# Patient Record
Sex: Female | Born: 1970 | Race: Black or African American | Hispanic: No | Smoking: Current every day smoker
Health system: Southern US, Community
[De-identification: ages and names within clinical notes are randomized; demographics above are authoritative.]

## PROBLEM LIST (undated history)

## (undated) DIAGNOSIS — I10 Essential (primary) hypertension: Secondary | ICD-10-CM

## (undated) DIAGNOSIS — I639 Cerebral infarction, unspecified: Secondary | ICD-10-CM

## (undated) DIAGNOSIS — E7800 Pure hypercholesterolemia, unspecified: Secondary | ICD-10-CM

## (undated) DIAGNOSIS — E119 Type 2 diabetes mellitus without complications: Secondary | ICD-10-CM

---

## 2020-10-14 DIAGNOSIS — M25512 Pain in left shoulder: Secondary | ICD-10-CM | POA: Insufficient documentation

## 2020-11-04 DIAGNOSIS — M19012 Primary osteoarthritis, left shoulder: Secondary | ICD-10-CM | POA: Insufficient documentation

## 2020-11-04 DIAGNOSIS — M7542 Impingement syndrome of left shoulder: Secondary | ICD-10-CM | POA: Insufficient documentation

## 2020-11-23 ENCOUNTER — Other Ambulatory Visit: Payer: Self-pay | Admitting: Gastroenterology

## 2020-11-23 DIAGNOSIS — R768 Other specified abnormal immunological findings in serum: Secondary | ICD-10-CM

## 2020-11-30 ENCOUNTER — Ambulatory Visit
Admission: RE | Admit: 2020-11-30 | Discharge: 2020-11-30 | Disposition: A | Discharge status: Home / Self Care | Payer: 59 | Source: Ambulatory Visit | Attending: Gastroenterology | Admitting: Gastroenterology

## 2020-11-30 ENCOUNTER — Other Ambulatory Visit: Payer: Self-pay

## 2020-11-30 DIAGNOSIS — R768 Other specified abnormal immunological findings in serum: Secondary | ICD-10-CM

## 2020-12-03 ENCOUNTER — Other Ambulatory Visit: Payer: Self-pay | Admitting: Gastroenterology

## 2020-12-03 DIAGNOSIS — R935 Abnormal findings on diagnostic imaging of other abdominal regions, including retroperitoneum: Secondary | ICD-10-CM

## 2020-12-15 ENCOUNTER — Ambulatory Visit
Admission: RE | Admit: 2020-12-15 | Discharge: 2020-12-15 | Disposition: A | Discharge status: Home / Self Care | Payer: 59 | Source: Ambulatory Visit | Attending: Gastroenterology | Admitting: Gastroenterology

## 2020-12-15 ENCOUNTER — Other Ambulatory Visit: Payer: Self-pay

## 2020-12-15 DIAGNOSIS — R935 Abnormal findings on diagnostic imaging of other abdominal regions, including retroperitoneum: Secondary | ICD-10-CM

## 2020-12-15 MED ORDER — GADOBENATE DIMEGLUMINE 529 MG/ML IV SOLN
10.0000 mL | Freq: Once | INTRAVENOUS | Status: AC | PRN
  Administered 2020-12-15: 10 mL via INTRAVENOUS

## 2021-02-01 ENCOUNTER — Encounter (HOSPITAL_BASED_OUTPATIENT_CLINIC_OR_DEPARTMENT_OTHER): Payer: Self-pay | Admitting: *Deleted

## 2021-02-01 ENCOUNTER — Emergency Department (HOSPITAL_BASED_OUTPATIENT_CLINIC_OR_DEPARTMENT_OTHER): Payer: 59

## 2021-02-01 ENCOUNTER — Other Ambulatory Visit: Payer: Self-pay

## 2021-02-01 ENCOUNTER — Emergency Department (HOSPITAL_BASED_OUTPATIENT_CLINIC_OR_DEPARTMENT_OTHER)
Admission: EM | Admit: 2021-02-01 | Discharge: 2021-02-01 | Disposition: A | Discharge status: Home / Self Care | Payer: 59 | Attending: Emergency Medicine | Admitting: Emergency Medicine

## 2021-02-01 DIAGNOSIS — Z79899 Other long term (current) drug therapy: Secondary | ICD-10-CM | POA: Diagnosis not present

## 2021-02-01 DIAGNOSIS — R091 Pleurisy: Secondary | ICD-10-CM | POA: Insufficient documentation

## 2021-02-01 DIAGNOSIS — I10 Essential (primary) hypertension: Secondary | ICD-10-CM | POA: Diagnosis not present

## 2021-02-01 DIAGNOSIS — M549 Dorsalgia, unspecified: Secondary | ICD-10-CM | POA: Diagnosis not present

## 2021-02-01 DIAGNOSIS — R079 Chest pain, unspecified: Secondary | ICD-10-CM | POA: Diagnosis present

## 2021-02-01 DIAGNOSIS — F17210 Nicotine dependence, cigarettes, uncomplicated: Secondary | ICD-10-CM | POA: Diagnosis not present

## 2021-02-01 DIAGNOSIS — Z7902 Long term (current) use of antithrombotics/antiplatelets: Secondary | ICD-10-CM | POA: Diagnosis not present

## 2021-02-01 HISTORY — DX: Cerebral infarction, unspecified: I63.9

## 2021-02-01 HISTORY — DX: Essential (primary) hypertension: I10

## 2021-02-01 LAB — CBC
HCT: 38.5 % (ref 36.0–46.0)
Hemoglobin: 12.8 g/dL (ref 12.0–15.0)
MCH: 32.6 pg (ref 26.0–34.0)
MCHC: 33.2 g/dL (ref 30.0–36.0)
MCV: 98 fL (ref 80.0–100.0)
Platelets: 361 10*3/uL (ref 150–400)
RBC: 3.93 MIL/uL (ref 3.87–5.11)
RDW: 13.2 % (ref 11.5–15.5)
WBC: 10.5 10*3/uL (ref 4.0–10.5)
nRBC: 0 % (ref 0.0–0.2)

## 2021-02-01 LAB — BASIC METABOLIC PANEL
Anion gap: 9 (ref 5–15)
BUN: 13 mg/dL (ref 6–20)
CO2: 23 mmol/L (ref 22–32)
Calcium: 9.3 mg/dL (ref 8.9–10.3)
Chloride: 107 mmol/L (ref 98–111)
Creatinine, Ser: 0.69 mg/dL (ref 0.44–1.00)
GFR, Estimated: >60 mL/min (ref >60)
Glucose, Bld: 97 mg/dL (ref 70–99)
Potassium: 3.8 mmol/L (ref 3.5–5.1)
Sodium: 139 mmol/L (ref 135–145)

## 2021-02-01 LAB — TROPONIN I (HIGH SENSITIVITY)
Troponin I (High Sensitivity): 4 ng/L (ref <18)
Troponin I (High Sensitivity): 3 ng/L (ref <18)

## 2021-02-01 LAB — D-DIMER, QUANTITATIVE: D-Dimer, Quant: 0.29 ug/mL-FEU (ref 0.00–0.50)

## 2021-02-01 LAB — PREGNANCY, URINE: Preg Test, Ur: NEGATIVE

## 2021-02-01 MED ORDER — DOXYCYCLINE HYCLATE 100 MG PO TABS: 100.0000 mg | ORAL_TABLET | Freq: Two times a day (BID) | ORAL | 0 refills | Status: AC

## 2021-02-01 MED ORDER — ETODOLAC 300 MG PO CAPS: 300.0000 mg | ORAL_CAPSULE | Freq: Three times a day (TID) | ORAL | 0 refills | Status: AC

## 2021-02-01 MED ORDER — ASPIRIN 81 MG PO CHEW
324.0000 mg | CHEWABLE_TABLET | Freq: Once | ORAL | Status: AC
  Administered 2021-02-01: 324 mg via ORAL
  Filled 2021-02-01: qty 4

## 2021-02-01 MED ORDER — MORPHINE SULFATE (PF) 4 MG/ML IV SOLN
4.0000 mg | Freq: Once | INTRAVENOUS | Status: AC
Start: 2021-02-01 — End: 2021-02-01
  Administered 2021-02-01: 4 mg via INTRAVENOUS

## 2021-02-01 NOTE — ED Notes (Signed)
PO snacks and oral fluids given to pt per pt request; okayed by Md.

## 2021-02-01 NOTE — ED Provider Notes (Signed)
[facility] EMERGENCY DEPT Provider Note   CSN: [id number] Arrival date & time: 02/01/21  9622     History Chief Complaint  Patient presents with   Chest Pain   Back Pain    Brandi Saunders is a 50 y.o. female.   Chest Pain Associated symptoms: back pain   Back Pain Associated symptoms: chest pain    HPI: A 50 year old patient with a history of CVA, hypertension and obesity presents for evaluation of chest pain. Initial onset of pain was approximately 1-3 hours ago. The patient's chest pain is well-localized, is sharp and is not worse with exertion. The patient's chest pain is middle- or left-sided, is not described as heaviness/pressure/tightness and does not radiate to the arms/jaw/neck. The patient does not complain of nausea and denies diaphoresis. The patient has smoked in the past 90 days. The patient has no history of peripheral artery disease, denies any history of treated diabetes, has no relevant family history of coronary artery disease (first degree relative at less than age 73) and has no history of hypercholesterolemia.  Pt experienced sharp pain this morning.  Increases with taking a deep breath.  Radiates to the back.  No leg swelling.  No hx of PE or DVT Past Medical History:  Diagnosis Date   Hypertension    Stroke [facility])     There are no problems to display for this patient.   History reviewed. No pertinent surgical history.   OB History     Gravida  14   Para  7   Term      Preterm  1   AB  5   Living         SAB      IAB      Ectopic      Multiple      Live Births              History reviewed. No pertinent family history.  Social History   Tobacco Use   Smoking status: Every Day    Packs/day: 0.50    Types: Cigarettes   Smokeless tobacco: Never  Vaping Use   Vaping Use: Never used  Substance Use Topics   Alcohol use: Yes    Comment: occasoinally   Drug use: Yes    Types: Marijuana    Comment: daily     Home Medications Prior to Admission medications   Medication Sig Start Date End Date Taking? Authorizing Provider  clopidogrel (PLAVIX) 75 MG tablet Take 75 mg by mouth daily. 02/01/21  Yes [provider]  cyclobenzaprine (FLEXERIL) 10 MG tablet cyclobenzaprine 10 mg tablet   Yes [provider]  doxycycline (VIBRA-TABS) 100 MG tablet Take 1 tablet (100 mg total) by mouth 2 (two) times daily for 7 days. 02/01/21 02/08/21 Yes Linwood Dibbles, MD  etodolac (LODINE) 300 MG capsule Take 1 capsule (300 mg total) by mouth every 8 (eight) hours for 7 days. 02/01/21 02/08/21 Yes Linwood Dibbles, MD  hydrochlorothiazide (HYDRODIURIL) 25 MG tablet Take 25 mg by mouth daily. 02/01/21  Yes [provider]  lisinopril (ZESTRIL) 40 MG tablet Take 40 mg by mouth daily. 02/01/21  Yes [provider]  simvastatin (ZOCOR) 20 MG tablet simvastatin 20 mg tablet   Yes [provider]    Allergies    Patient has no known allergies.  Review of Systems   Review of Systems  Cardiovascular:  Positive for chest pain.  Musculoskeletal:  Positive for back pain.  All  other systems reviewed and are negative.  Physical Exam Updated Vital Signs BP 125/83   Pulse 70   Temp 98.7 F (37.1 C) (Oral)   Resp 20   Ht 1.549 m (5\' 1" )   Wt 77.6 kg   LMP 02/01/2021 (Exact Date)   SpO2 99%   BMI 32.31 kg/m   Physical Exam Vitals and nursing note reviewed.  Constitutional:      General: She is not in acute distress.    Appearance: She is well-developed.  HENT:     Head: Normocephalic and atraumatic.     Right Ear: External ear normal.     Left Ear: External ear normal.  Eyes:     General: No scleral icterus.       Right eye: No discharge.        Left eye: No discharge.     Conjunctiva/sclera: Conjunctivae normal.  Neck:     Trachea: No tracheal deviation.  Cardiovascular:     Rate and Rhythm: Normal rate and regular rhythm.  Pulmonary:     Effort: Pulmonary effort is  normal. No respiratory distress.     Breath sounds: Normal breath sounds. No stridor. No wheezing or rales.  Abdominal:     General: Bowel sounds are normal. There is no distension.     Palpations: Abdomen is soft.     Tenderness: There is no abdominal tenderness. There is no guarding or rebound.  Musculoskeletal:        General: No tenderness or deformity.     Cervical back: Neck supple.  Skin:    General: Skin is warm and dry.     Findings: No rash.  Neurological:     General: No focal deficit present.     Mental Status: She is alert.     Cranial Nerves: No cranial nerve deficit (no facial droop, extraocular movements intact, no slurred speech).     Sensory: No sensory deficit.     Motor: No abnormal muscle tone or seizure activity.     Coordination: Coordination normal.  Psychiatric:        Mood and Affect: Mood normal.    ED Results / Procedures / Treatments   Labs (all labs ordered are listed, but only abnormal results are displayed) Labs Reviewed  BASIC METABOLIC PANEL  CBC  PREGNANCY, URINE  D-DIMER, QUANTITATIVE  TROPONIN I (HIGH SENSITIVITY)  TROPONIN I (HIGH SENSITIVITY)    EKG EKG Interpretation  Date/Time:  Wednesday February 01 2021 09:14:47 EDT Ventricular Rate:  72 PR Interval:  148 QRS Duration: 98 QT Interval:  396 QTC Calculation: 434 R Axis:   6 Text Interpretation: Sinus rhythm Nonspecific T abnrm, anterolateral leads Artifact Confirmed by 09-23[birth year]1989 [phone]) on 02/01/2021 9:16:25 AM  Radiology DG Chest Portable 1 View  Result Date: 02/01/2021 CLINICAL DATA:  Chest pain. EXAM: PORTABLE CHEST 1 VIEW COMPARISON:  None. FINDINGS: Mild left basilar opacities. No visible pneumothorax or pleural effusion. Borderline enlargement of the cardiac silhouette. No acute osseous abnormality. Slight S-shaped thoracic curvature. IMPRESSION: 1. Mild left basilar opacities, which could represent atelectasis or early pneumonia. 2. Borderline cardiomegaly.  Electronically Signed   By: 02/03/2021 M.D.   On: 02/01/2021 10:00    Procedures Procedures   Medications Ordered in ED Medications  aspirin chewable tablet 324 mg (324 mg Oral Given 02/01/21 1020)  morphine 4 MG/ML injection 4 mg (4 mg Intravenous Given 02/01/21 1020)    ED Course  I have reviewed the triage vital signs  and the nursing notes.  Pertinent labs & imaging results that were available during my care of the patient were reviewed by me and considered in my medical decision making (see chart for details).  Clinical Course as of 02/01/21 1310  Wed Feb 01, 2021  1019 Initial trop, labs normal.  No signs of cardiac ischemia.  Low risk for PE.  D dimer negative [JK]  1020 CXR atelectasis vs pna [JK]    Clinical Course User Index [JK] Linwood Dibbles, MD   MDM Rules/Calculators/A&P HEAR Score: 4                         ED work-up is reassuring.  No signs of pneumothorax on chest x-ray.  Questionable pneumonia although could be atelectasis.  Patient is not having any other infectious symptoms.  D-dimer is negative.  Doubt PE.  Serial troponins are normal.  Doubt symptoms related to acute coronary syndrome.  Symptoms may be related to viral pleurisy.  Will discharge home with NSAIDs   Because of the question of pneumonia we will give her a course of antibiotics.  Outpatient follow-up Final Clinical Impression(s) / ED Diagnoses Final diagnoses:  Pleurisy    Rx / DC Orders ED Discharge Orders          Ordered    doxycycline (VIBRA-TABS) 100 MG tablet  2 times daily        02/01/21 1305    etodolac (LODINE) 300 MG capsule  Every 8 hours       Note to Pharmacy: As needed for pain   02/01/21 1305             Linwood Dibbles, MD 02/01/21 1310

## 2021-02-01 NOTE — ED Notes (Signed)
ED Provider at bedside. 

## 2021-02-01 NOTE — ED Triage Notes (Signed)
Woke up this morning with pain to her chest radiating to her back when she takes a deep breath.  Patient is a smoker.

## 2021-02-01 NOTE — Discharge Instructions (Addendum)
Take the Lodine to help with the pain and inflammation.  As we discussed, the chest x-ray showed a possibility of pneumonia although it was not definitive.  Doxycycline is an antibiotic to treat pneumonia.  Start taking that if you start having symptoms, fevers in the last couple days

## 2021-05-19 ENCOUNTER — Ambulatory Visit (HOSPITAL_COMMUNITY)
Admission: EM | Admit: 2021-05-19 | Discharge: 2021-05-19 | Disposition: A | Discharge status: Home / Self Care | Payer: 59 | Attending: Physician Assistant | Admitting: Physician Assistant

## 2021-05-19 ENCOUNTER — Other Ambulatory Visit: Payer: Self-pay

## 2021-05-19 ENCOUNTER — Encounter (HOSPITAL_COMMUNITY): Payer: Self-pay | Admitting: Emergency Medicine

## 2021-05-19 DIAGNOSIS — T783XXA Angioneurotic edema, initial encounter: Secondary | ICD-10-CM

## 2021-05-19 DIAGNOSIS — S93402A Sprain of unspecified ligament of left ankle, initial encounter: Secondary | ICD-10-CM

## 2021-05-19 MED ORDER — AMLODIPINE BESYLATE 5 MG PO TABS: 5.0000 mg | ORAL_TABLET | Freq: Every day | ORAL | 1 refills | Status: AC

## 2021-05-19 NOTE — ED Provider Notes (Signed)
[facility]    CSN: [id number] Arrival date & time: 05/19/21  8315      History   Chief Complaint Chief Complaint  Patient presents with   Facial Swelling   Ankle Pain    HPI Brandi Saunders is a 51 y.o. female.   Pt turned her left ankle.  Pt complains of continued pain.  Pt reports pain with walking.  Pt also complains of swelling to her lip for 3 days.  Pt is on lisinopril.    The history is provided by the patient. No language interpreter was used.  Ankle Pain Location:  Knee Worsened by:  Nothing Ineffective treatments:  None tried  Past Medical History:  Diagnosis Date   Hypertension    Stroke (HCC)     There are no problems to display for this patient.   History reviewed. No pertinent surgical history.  OB History     Gravida  14   Para  7   Term      Preterm  1   AB  5   Living         SAB      IAB      Ectopic      Multiple      Live Births               Home Medications    Prior to Admission medications   Medication Sig Start Date End Date Taking? Authorizing Provider  amLODipine (NORVASC) 5 MG tablet Take 1 tablet (5 mg total) by mouth daily. 05/19/21 05/19/22 Yes Elson Areas, PA-C  clopidogrel (PLAVIX) 75 MG tablet Take 75 mg by mouth daily. 02/01/21   [provider]  cyclobenzaprine (FLEXERIL) 10 MG tablet cyclobenzaprine 10 mg tablet    [provider]  hydrochlorothiazide (HYDRODIURIL) 25 MG tablet Take 25 mg by mouth daily. 02/01/21   [provider]  lisinopril (ZESTRIL) 40 MG tablet Take 40 mg by mouth daily. 02/01/21   [provider]  simvastatin (ZOCOR) 20 MG tablet simvastatin 20 mg tablet    [provider]    Family History No family history on file.  Social History Social History   Tobacco Use   Smoking status: Every Day    Packs/day: 0.50    Types: Cigarettes   Smokeless tobacco: Never  Vaping Use   Vaping Use: Never used  Substance Use Topics    Alcohol use: Yes    Comment: occasoinally   Drug use: Yes    Types: Marijuana    Comment: daily     Allergies   Patient has no known allergies.   Review of Systems Review of Systems  All other systems reviewed and are negative.   Physical Exam Triage Vital Signs ED Triage Vitals  Enc Vitals Group     BP 05/19/21 1053 104/78     Pulse Rate 05/19/21 1053 76     Resp 05/19/21 1053 16     Temp 05/19/21 1053 98.2 F (36.8 C)     Temp Source 05/19/21 1053 Oral     SpO2 05/19/21 1053 99 %     Weight --      Height --      Head Circumference --      Peak Flow --      Pain Score 05/19/21 1050 0     Pain Loc --      Pain Edu? --      Excl. in GC? --  No data found.  Updated Vital Signs BP 104/78 (BP Location: Left Arm)    Pulse 76    Temp 98.2 F (36.8 C) (Oral)    Resp 16    SpO2 99%   Visual Acuity Right Eye Distance:   Left Eye Distance:   Bilateral Distance:    Right Eye Near:   Left Eye Near:    Bilateral Near:     Physical Exam Vitals and nursing note reviewed.  Constitutional:      Appearance: She is well-developed.  HENT:     Head: Normocephalic.     Mouth/Throat:     Mouth: Mucous membranes are moist.     Comments: Swollen upper lip  Cardiovascular:     Rate and Rhythm: Normal rate.  Pulmonary:     Effort: Pulmonary effort is normal.  Abdominal:     General: There is no distension.  Musculoskeletal:        General: Swelling and tenderness present. Normal range of motion.     Cervical back: Normal range of motion.     Comments: tender left ankle   Skin:    General: Skin is warm.  Neurological:     Mental Status: She is alert and oriented to person, place, and time.  Psychiatric:        Mood and Affect: Mood normal.     UC Treatments / Results  Labs (all labs ordered are listed, but only abnormal results are displayed) Labs Reviewed - No data to display  EKG   Radiology No results found.  Procedures Procedures (including  critical care time)  Medications Ordered in UC Medications - No data to display  Initial Impression / Assessment and Plan / UC Course  I have reviewed the triage vital signs and the nursing notes.  Pertinent labs & imaging results that were available during my care of the patient were reviewed by me and considered in my medical decision making (see chart for details).     MDM:  Pt counseled on angioedema.  Pt advised to stop lisinopril.  Pt given rx for amlodipine Pt placed in an ace wrap for ankle sprain   Final Clinical Impressions(s) / UC Diagnoses   Final diagnoses:  Angioedema, initial encounter  Sprain of left ankle, unspecified ligament, initial encounter     Discharge Instructions      Stop taking lisinopril.     ED Prescriptions     Medication Sig Dispense Auth. Provider   amLODipine (NORVASC) 5 MG tablet Take 1 tablet (5 mg total) by mouth daily. 30 tablet Elson Areas, [place]      PDMP not reviewed this encounter. An After Visit Summary was printed and given to the patient.    Elson Areas, [place] 05/19/21 1122

## 2021-05-19 NOTE — Discharge Instructions (Addendum)
Stop taking lisinopril

## 2021-05-19 NOTE — ED Triage Notes (Signed)
Pt reports left side facial swelling for 2 days. Denies new foods, medications, etc.  Reports couple weeks ago fell when stepping off her porch. Left ankle pains.

## 2021-12-18 ENCOUNTER — Other Ambulatory Visit: Payer: Self-pay | Admitting: Registered Nurse

## 2021-12-18 DIAGNOSIS — I70223 Atherosclerosis of native arteries of extremities with rest pain, bilateral legs: Secondary | ICD-10-CM

## 2021-12-20 ENCOUNTER — Ambulatory Visit
Admission: RE | Admit: 2021-12-20 | Discharge: 2021-12-20 | Disposition: A | Discharge status: Home / Self Care | Payer: Medicaid Other | Source: Ambulatory Visit | Attending: Registered Nurse | Admitting: Registered Nurse

## 2021-12-20 DIAGNOSIS — I70223 Atherosclerosis of native arteries of extremities with rest pain, bilateral legs: Secondary | ICD-10-CM

## 2022-03-14 ENCOUNTER — Ambulatory Visit: Payer: Medicare Other | Admitting: Podiatry

## 2022-03-21 ENCOUNTER — Ambulatory Visit (INDEPENDENT_AMBULATORY_CARE_PROVIDER_SITE_OTHER): Payer: Medicare Other | Admitting: Podiatry

## 2022-03-21 ENCOUNTER — Encounter: Payer: Self-pay | Admitting: Podiatry

## 2022-03-21 DIAGNOSIS — L84 Corns and callosities: Secondary | ICD-10-CM | POA: Diagnosis not present

## 2022-03-21 NOTE — Progress Notes (Signed)
Subjective: Brandi Saunders presents today referred by Podiatrist, Dr. Larey Dresser for follow up of callus(es) left lower extremity. Patient states "something" was applied to her foot and she was told to follow up with our office.  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 No pertinent surgical history.   Current Outpatient Medications on File Prior to Visit  Medication Sig Dispense Refill   amLODipine (NORVASC) 5 MG tablet Take 1 tablet (5 mg total) by mouth daily. 30 tablet 1   clopidogrel (PLAVIX) 75 MG tablet Take 75 mg by mouth daily.     cyclobenzaprine (FLEXERIL) 10 MG tablet cyclobenzaprine 10 mg tablet     hydrochlorothiazide (HYDRODIURIL) 25 MG tablet Take 25 mg by mouth daily.     lisinopril (ZESTRIL) 40 MG tablet Take 40 mg by mouth daily.     simvastatin (ZOCOR) 20 MG tablet simvastatin 20 mg tablet     No current facility-administered medications on file prior to visit.     No Known Allergies   Social History   Occupational History   Not on file  Tobacco Use   Smoking status: Every Day    Packs/day: 0.50    Types: Cigarettes   Smokeless tobacco: Never  Vaping Use   Vaping Use: Never used  Substance and Sexual Activity   Alcohol use: Yes    Comment: occasoinally   Drug use: Yes    Types: Marijuana    Comment: daily   Sexual activity: Not on file     History reviewed. No pertinent family history.    There is no immunization history on file for this patient.   Objective: There were no vitals filed for this visit.  Brandi Saunders is a pleasant 51 y.o. female WD, WN in NAD. AAO x 3.  Vascular Examination: Vascular status intact b/l with palpable pedal pulses. Pedal hair present  b/l. CFT immediate b/l. No edema. No pain with calf compression b/l. Skin temperature gradient WNL b/l.   Neurological Examination: Sensation grossly intact b/l with 10 gram monofilament. Vibratory sensation intact b/l.   Dermatological Examination: Pedal skin with normal turgor, texture and tone b/l.  Possible evidence of Cantharone solution submet head 5 left foot. Minimal hyperkeratosis submet head 5 left foot. No open wounds b/l LE. No interdigital macerations noted b/l LE.    Musculoskeletal Examination: Muscle strength 5/5 to b/l LE. No pain, crepitus or joint limitation noted with ROM bilateral LE. No gross bony deformities bilaterally.  Radiographs: None  Assessment: 1. Callus     Plan: -Patient was evaluated and treated. All patient's and/or POA's questions/concerns answered on today's visit. -Examined patient. -Minimal hyperkeratosis noted s/p cantharone treatment. No debridement necessary today. -Patient to continue soft, supportive shoe gear daily. -Patient/POA to call should there be question/concern in the interim.  Return if symptoms worsen or fail to improve.  Freddie Breech, DPM

## 2022-04-25 ENCOUNTER — Ambulatory Visit: Payer: Medicare Other | Admitting: Podiatry

## 2022-08-28 DIAGNOSIS — Z1151 Encounter for screening for human papillomavirus (HPV): Secondary | ICD-10-CM | POA: Diagnosis not present

## 2022-08-28 DIAGNOSIS — N898 Other specified noninflammatory disorders of vagina: Secondary | ICD-10-CM | POA: Diagnosis not present

## 2022-08-28 DIAGNOSIS — N762 Acute vulvitis: Secondary | ICD-10-CM | POA: Diagnosis not present

## 2022-08-28 DIAGNOSIS — Z124 Encounter for screening for malignant neoplasm of cervix: Secondary | ICD-10-CM | POA: Diagnosis not present

## 2022-08-28 DIAGNOSIS — Z113 Encounter for screening for infections with a predominantly sexual mode of transmission: Secondary | ICD-10-CM | POA: Diagnosis not present

## 2022-08-28 DIAGNOSIS — Z01419 Encounter for gynecological examination (general) (routine) without abnormal findings: Secondary | ICD-10-CM | POA: Diagnosis not present

## 2022-09-04 IMAGING — US US ABDOMEN LIMITED
1 series · 14 of 25 positions shown · non-contrast
Comparison: None.

CLINICAL DATA: Hepatitis-B antibody positive

EXAM:
ULTRASOUND ABDOMEN LIMITED RIGHT UPPER QUADRANT

[Series 1: us abdomen limited · 0.19mm/px · 14 of 74 slices shown]
[im 1/74]
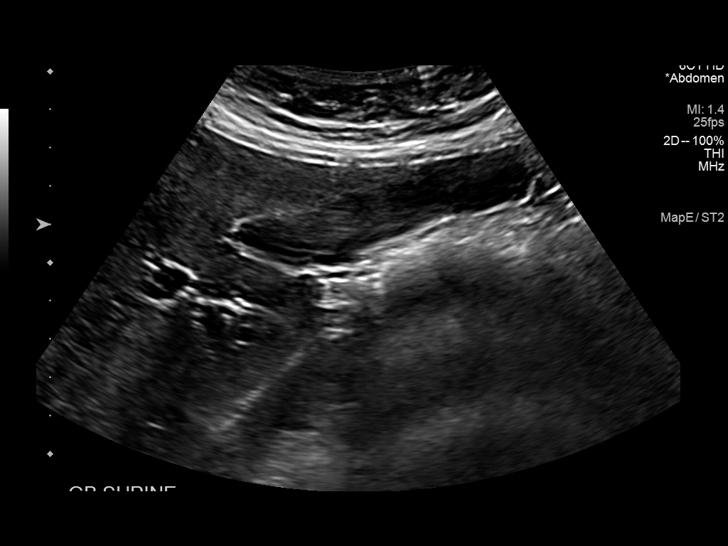
[im 7/74]
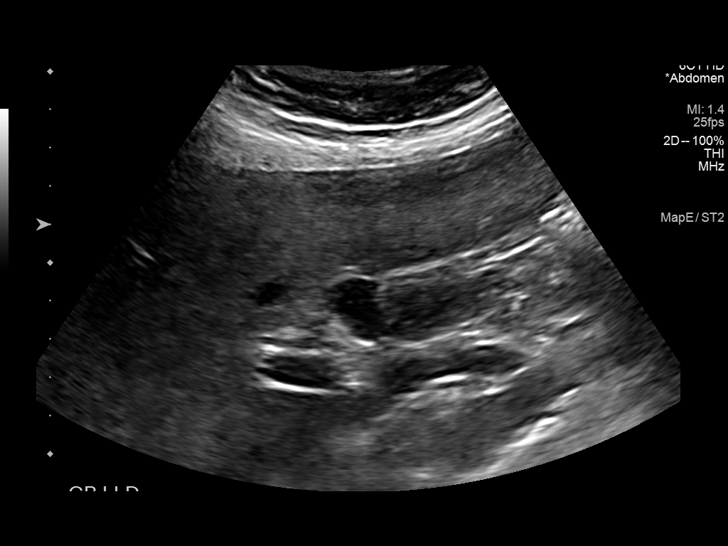
[im 13/74]
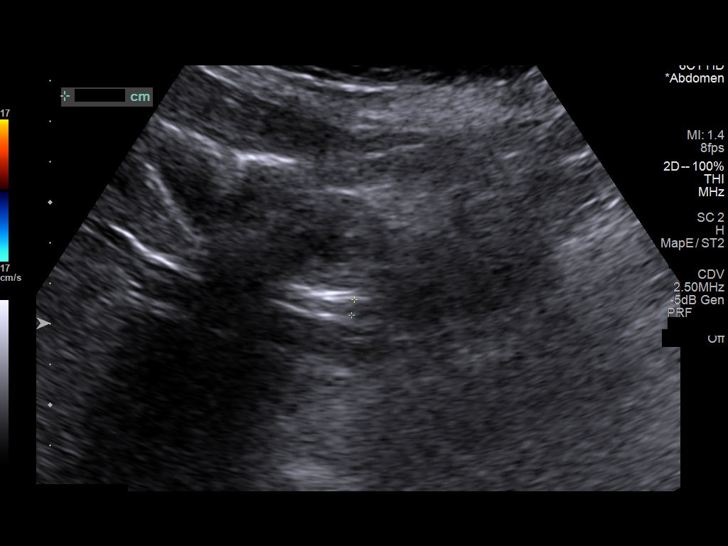
[im 19/74]
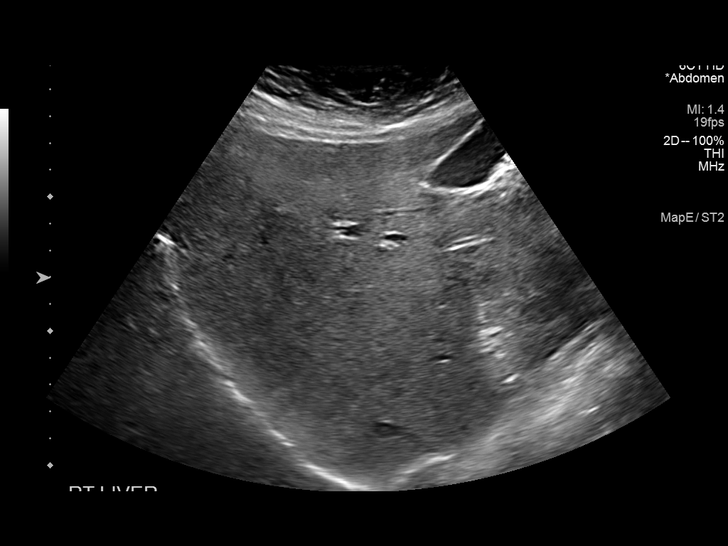
[im 25/74]
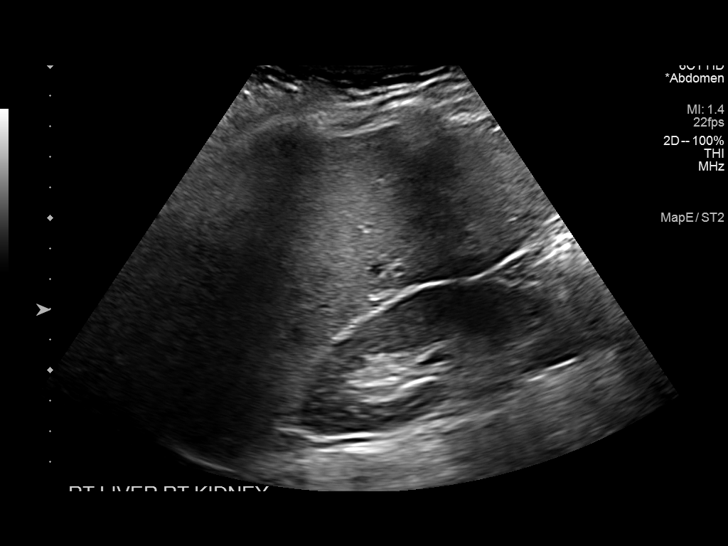
[im 28/74]
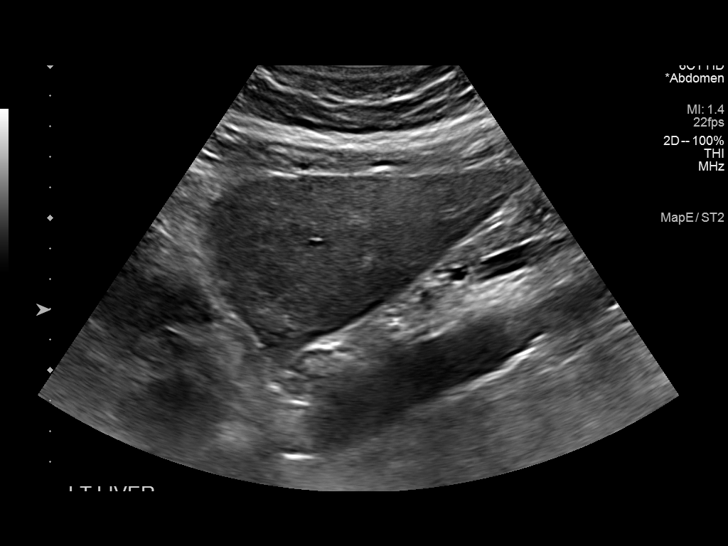
[im 34/74]
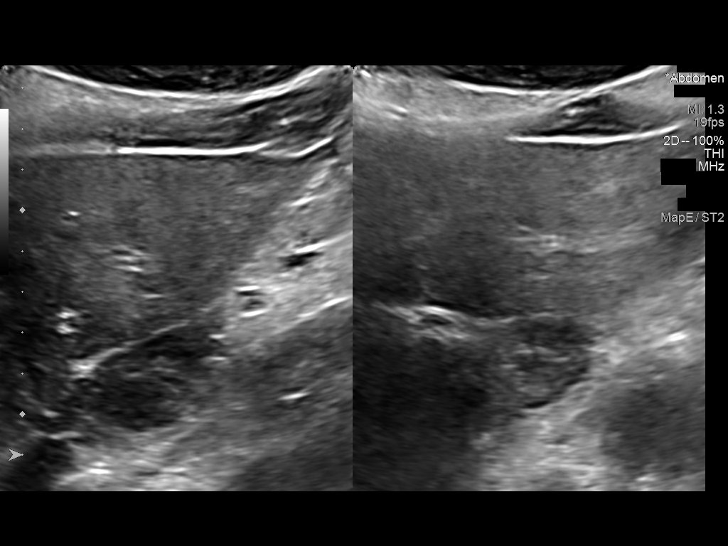
[im 40/74]
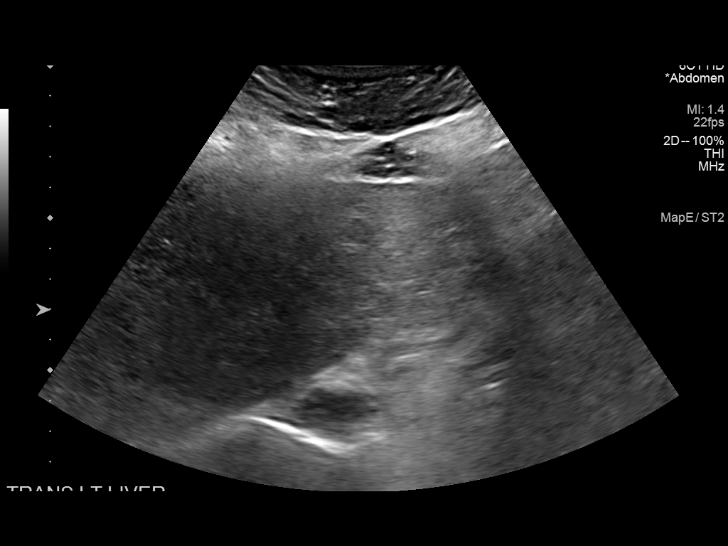
[im 46/74]
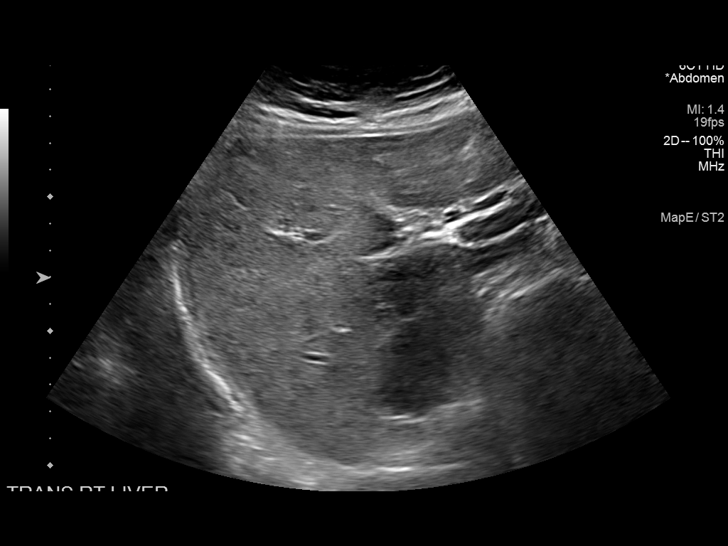
[im 49/74]
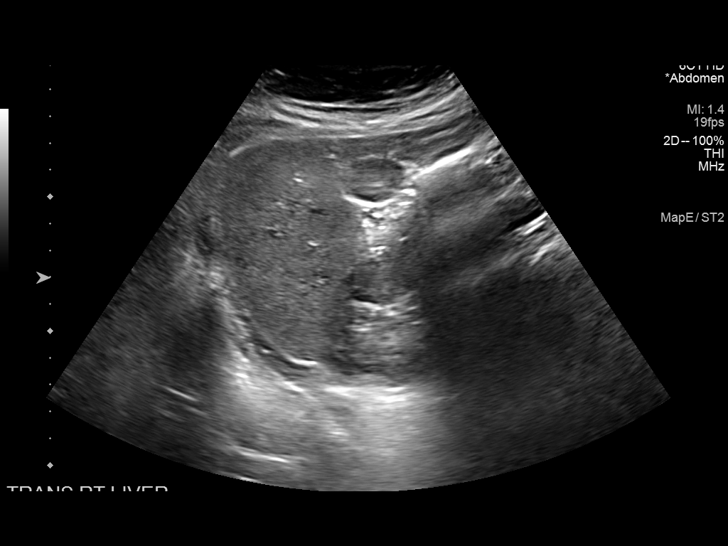
[im 55/74]
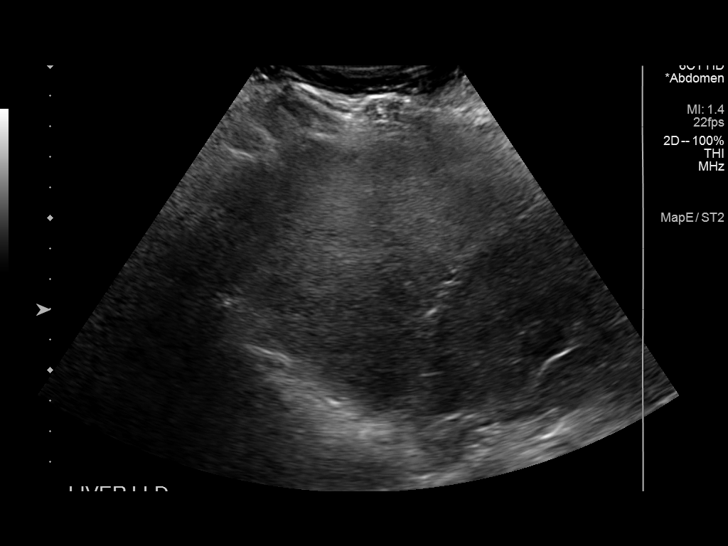
[im 61/74]
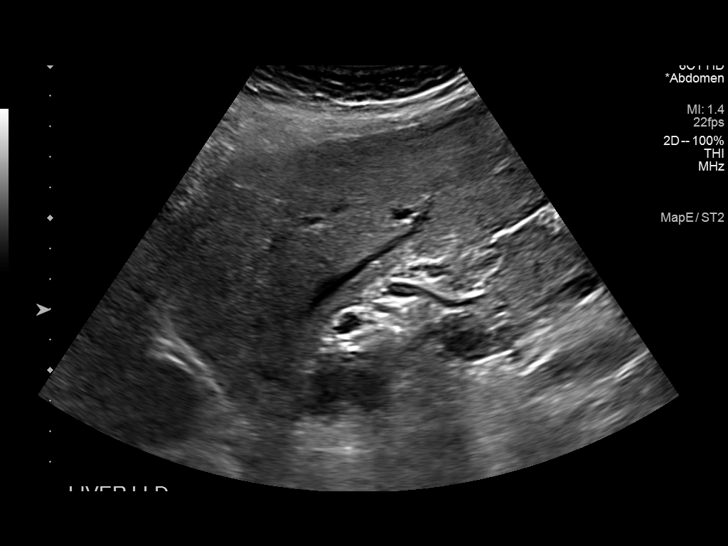
[im 67/74]
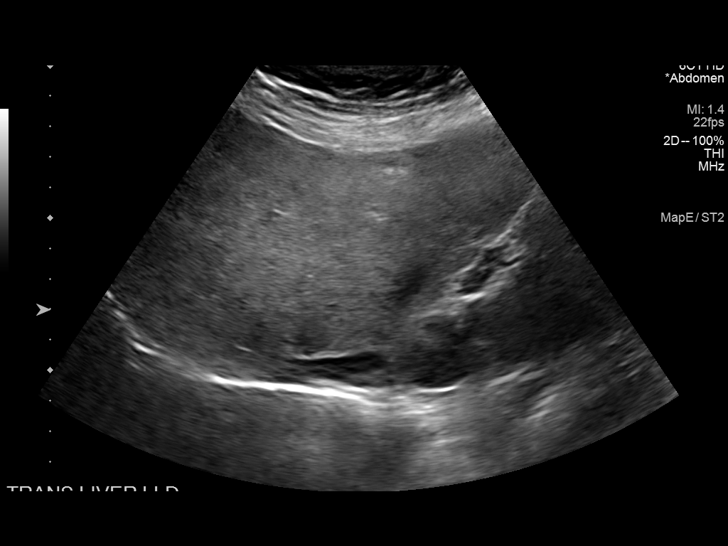
[im 74/74]
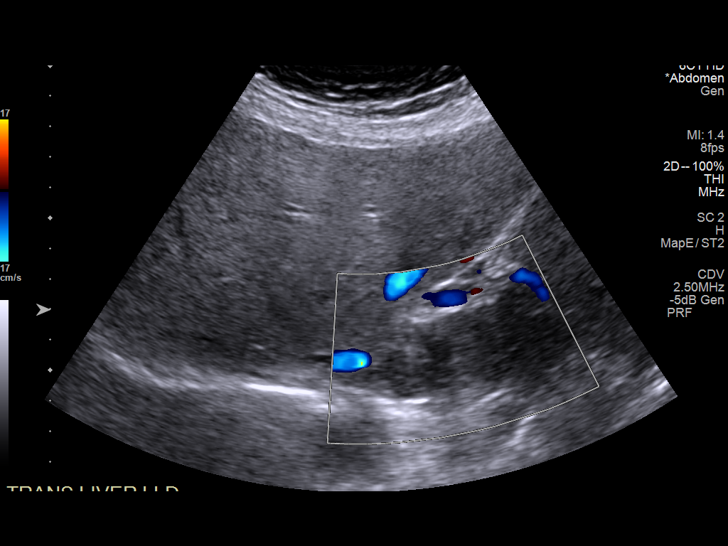

[14 of 25 positions shown; findings below may reference images not displayed]

FINDINGS: Gallbladder:

No gallstones or wall thickening visualized. No sonographic Murphy
sign noted by sonographer.

Common bile duct:

Diameter: 4 mm, normal

Liver:

There is heterogeneous liver echotexture. Liver echotexture is
diffusely increased. In the region of the caudate lobe, there is
focally heterogeneous rounded tissue. It measures approximately
x 2.2 x 2.8 cm. Portal vein is patent on color Doppler imaging with
normal direction of blood flow towards the liver.

Other: None.
IMPRESSION: 1. The caudate lobe is rounded in appearance with focally
heterogeneous tissue versus underlying mass which measures 3.3 cm.
Given clinical history, recommend further evaluation with dedicated
liver MRI with and without contrast to evaluate for underlying
lesion.
2. Hepatic steatosis.

These results will be called to the ordering clinician or
representative by the Radiologist Assistant, and communication
documented in the PACS or [REDACTED].

## 2022-09-27 ENCOUNTER — Other Ambulatory Visit: Payer: Self-pay

## 2022-10-01 ENCOUNTER — Emergency Department (HOSPITAL_COMMUNITY): Payer: 59

## 2022-10-01 ENCOUNTER — Emergency Department (HOSPITAL_COMMUNITY)
Admission: EM | Admit: 2022-10-01 | Discharge: 2022-10-01 | Disposition: A | Discharge status: Home / Self Care | Payer: 59 | Attending: Emergency Medicine | Admitting: Emergency Medicine

## 2022-10-01 ENCOUNTER — Encounter (HOSPITAL_COMMUNITY): Payer: Self-pay

## 2022-10-01 DIAGNOSIS — R5383 Other fatigue: Secondary | ICD-10-CM | POA: Diagnosis not present

## 2022-10-01 DIAGNOSIS — E876 Hypokalemia: Secondary | ICD-10-CM | POA: Diagnosis not present

## 2022-10-01 DIAGNOSIS — I69351 Hemiplegia and hemiparesis following cerebral infarction affecting right dominant side: Secondary | ICD-10-CM | POA: Insufficient documentation

## 2022-10-01 DIAGNOSIS — Z79899 Other long term (current) drug therapy: Secondary | ICD-10-CM | POA: Diagnosis not present

## 2022-10-01 DIAGNOSIS — R531 Weakness: Secondary | ICD-10-CM

## 2022-10-01 DIAGNOSIS — I10 Essential (primary) hypertension: Secondary | ICD-10-CM | POA: Diagnosis not present

## 2022-10-01 DIAGNOSIS — Z7982 Long term (current) use of aspirin: Secondary | ICD-10-CM | POA: Insufficient documentation

## 2022-10-01 HISTORY — DX: Pure hypercholesterolemia, unspecified: E78.00

## 2022-10-01 HISTORY — DX: Type 2 diabetes mellitus without complications: E11.9

## 2022-10-01 LAB — COMPREHENSIVE METABOLIC PANEL
ALT: 20 U/L (ref 0–44)
AST: 21 U/L (ref 15–41)
Albumin: 3.7 g/dL (ref 3.5–5.0)
Alkaline Phosphatase: 64 U/L (ref 38–126)
Anion gap: 13 (ref 5–15)
BUN: 13 mg/dL (ref 6–20)
CO2: 27 mmol/L (ref 22–32)
Calcium: 10 mg/dL (ref 8.9–10.3)
Chloride: 98 mmol/L (ref 98–111)
Creatinine, Ser: 0.83 mg/dL (ref 0.44–1.00)
GFR, Estimated: >60 mL/min (ref >60)
Glucose, Bld: 95 mg/dL (ref 70–99)
Potassium: 2.8 mmol/L — ABNORMAL LOW (ref 3.5–5.1)
Sodium: 138 mmol/L (ref 135–145)
Total Bilirubin: 0.6 mg/dL (ref 0.3–1.2)
Total Protein: 7.3 g/dL (ref 6.5–8.1)

## 2022-10-01 LAB — CK: Total CK: 110 U/L (ref 38–234)

## 2022-10-01 LAB — CBC WITH DIFFERENTIAL/PLATELET
Abs Immature Granulocytes: 0.03 10*3/uL (ref 0.00–0.07)
Basophils Absolute: 0 10*3/uL (ref 0.0–0.1)
Basophils Relative: 0 %
Eosinophils Absolute: 0.1 10*3/uL (ref 0.0–0.5)
Eosinophils Relative: 1 %
HCT: 42.6 % (ref 36.0–46.0)
Hemoglobin: 14.1 g/dL (ref 12.0–15.0)
Immature Granulocytes: 0 %
Lymphocytes Relative: 31 %
Lymphs Abs: 3.7 10*3/uL (ref 0.7–4.0)
MCH: 32 pg (ref 26.0–34.0)
MCHC: 33.1 g/dL (ref 30.0–36.0)
MCV: 96.6 fL (ref 80.0–100.0)
Monocytes Absolute: 0.9 10*3/uL (ref 0.1–1.0)
Monocytes Relative: 7 %
Neutro Abs: 7.1 10*3/uL (ref 1.7–7.7)
Neutrophils Relative %: 61 %
Platelets: 338 10*3/uL (ref 150–400)
RBC: 4.41 MIL/uL (ref 3.87–5.11)
RDW: 12.9 % (ref 11.5–15.5)
WBC: 11.8 10*3/uL — ABNORMAL HIGH (ref 4.0–10.5)
nRBC: 0 % (ref 0.0–0.2)

## 2022-10-01 LAB — MAGNESIUM: Magnesium: 1.9 mg/dL (ref 1.7–2.4)

## 2022-10-01 LAB — CBG MONITORING, ED: Glucose-Capillary: 95 mg/dL (ref 70–99)

## 2022-10-01 MED ORDER — POTASSIUM CHLORIDE CRYS ER 20 MEQ PO TBCR
40.0000 meq | EXTENDED_RELEASE_TABLET | Freq: Once | ORAL | Status: AC
  Administered 2022-10-01: 40 meq via ORAL
  Filled 2022-10-01: qty 2

## 2022-10-01 MED ORDER — SODIUM CHLORIDE 0.9 % IV BOLUS
1000.0000 mL | Freq: Once | INTRAVENOUS | Status: AC
  Administered 2022-10-01: 1000 mL via INTRAVENOUS

## 2022-10-01 MED ORDER — POTASSIUM CHLORIDE 10 MEQ/100ML IV SOLN
10.0000 meq | Freq: Once | INTRAVENOUS | Status: AC
  Administered 2022-10-01: 10 meq via INTRAVENOUS
  Filled 2022-10-01: qty 100

## 2022-10-01 MED ORDER — POTASSIUM CHLORIDE ER 10 MEQ PO TBCR: 20.0000 meq | EXTENDED_RELEASE_TABLET | Freq: Every day | ORAL | 0 refills | Status: AC

## 2022-10-01 NOTE — ED Provider Notes (Signed)
[place] EMERGENCY DEPARTMENT AT [facility] Provider Note   CSN: [id number] Arrival date & time: 10/01/22  1327     History  No chief complaint on file.   Brandi Saunders is a 52 y.o. female with history of high cholesterol, hypertension, presented to ED with complaint of general fatigue.  Patient reports abrupt onset of sensation of fatigue this afternoon.  He says he feels like he is moving through a fog with a mild headache.  She denies any pain or pressure.  Denies vomiting.  She has normal appetite.  Denies sick contacts in the house.  Patient reports that she is a history of a stroke with residual right-sided weakness, right leg greater than right arm, which was diagnosed and managed in [place].  She takes aspirin daily for this.  HPI     Home Medications Prior to Admission medications   Medication Sig Start Date End Date Taking? Authorizing Provider  potassium chloride (KLOR-CON) 10 MEQ tablet Take 2 tablets (20 mEq total) by mouth daily for 10 days. 10/01/22 10/11/22 Yes Eligio Angert, Kermit Balo, MD  amLODipine (NORVASC) 5 MG tablet Take 1 tablet (5 mg total) by mouth daily. 05/19/21 05/19/22  Elson Areas, PA-C  clopidogrel (PLAVIX) 75 MG tablet Take 75 mg by mouth daily. 02/01/21   [provider]  cyclobenzaprine (FLEXERIL) 10 MG tablet cyclobenzaprine 10 mg tablet    [provider]  hydrochlorothiazide (HYDRODIURIL) 25 MG tablet Take 25 mg by mouth daily. 02/01/21   [provider]  lisinopril (ZESTRIL) 40 MG tablet Take 40 mg by mouth daily. 02/01/21   [provider]  simvastatin (ZOCOR) 20 MG tablet simvastatin 20 mg tablet    [provider]      Allergies    Patient has no known allergies.    Review of Systems   Review of Systems  Physical Exam Updated Vital Signs BP (!) 116/91   Pulse 69   Temp 97.8 F (36.6 C) (Oral)   Resp 15   Ht 5\' 4"  (1.626 m)   Wt 75.8 kg   SpO2 94%   BMI 28.67 kg/m  Physical  Exam Constitutional:      General: She is not in acute distress. HENT:     Head: Normocephalic and atraumatic.  Eyes:     Conjunctiva/sclera: Conjunctivae normal.     Pupils: Pupils are equal, round, and reactive to light.  Cardiovascular:     Rate and Rhythm: Normal rate and regular rhythm.  Pulmonary:     Effort: Pulmonary effort is normal. No respiratory distress.  Abdominal:     General: There is no distension.     Tenderness: There is no abdominal tenderness.  Skin:    General: Skin is warm and dry.  Neurological:     General: No focal deficit present.     Mental Status: She is alert and oriented to person, place, and time. Mental status is at baseline.     Sensory: No sensory deficit.     Coordination: Coordination normal.     Comments: Chronic right lower extremity weakness, 4-5 strength, which patient says is baseline  Psychiatric:        Mood and Affect: Mood normal.        Behavior: Behavior normal.     ED Results / Procedures / Treatments   Labs (all labs ordered are listed, but only abnormal results are displayed) Labs Reviewed  CBC WITH DIFFERENTIAL/PLATELET - Abnormal; Notable for the following components:  Result Value   WBC 11.8 (*)    All other components within normal limits  COMPREHENSIVE METABOLIC PANEL - Abnormal; Notable for the following components:   Potassium 2.8 (*)    All other components within normal limits  CK  MAGNESIUM  URINALYSIS, ROUTINE W REFLEX MICROSCOPIC  CBG MONITORING, ED    EKG EKG Interpretation  Date/Time:  Monday Oct 01 2022 13:44:25 EDT Ventricular Rate:  75 PR Interval:  138 QRS Duration: 98 QT Interval:  396 QTC Calculation: 442 R Axis:   -16 Text Interpretation: Sinus rhythm with occasional Premature ventricular complexes When compared with ECG of 01-Feb-2021 09:14, PREVIOUS ECG IS PRESENT No sig change from prior tracing Confirmed by Alvester Chou [id number]) on 10/01/2022 5:42:57 PM  Radiology DG Chest  Portable 1 View  Result Date: 10/01/2022 CLINICAL DATA:  Generalized weakness, diabetes EXAM: PORTABLE CHEST 1 VIEW COMPARISON:  02/01/2021 FINDINGS: The heart size and mediastinal contours are within normal limits. Both lungs are clear. The visualized skeletal structures are unremarkable. IMPRESSION: No active disease. Electronically Signed   By: Sharlet Salina M.D.   On: 10/01/2022 18:34    Procedures .Critical Care  Performed by: Terald Sleeper, MD Authorized by: Terald Sleeper, MD   Critical care provider statement:    Critical care time (minutes):  45   Critical care time was exclusive of:  Separately billable procedures and treating other patients   Critical care was necessary to treat or prevent imminent or life-threatening deterioration of the following conditions:  Metabolic crisis   Critical care was time spent personally by me on the following activities:  Ordering and performing treatments and interventions, ordering and review of laboratory studies, ordering and review of radiographic studies, pulse oximetry, review of old charts, examination of patient and evaluation of patient's response to treatment Comments:     IV potassium repletion     Medications Ordered in ED Medications  potassium chloride SA (KLOR-CON M) CR tablet 40 mEq (40 mEq Oral Given 10/01/22 1842)  potassium chloride 10 mEq in 100 mL IVPB (0 mEq Intravenous Stopped 10/01/22 2005)  sodium chloride 0.9 % bolus 1,000 mL (0 mLs Intravenous Stopped 10/01/22 2005)    ED Course/ Medical Decision Making/ A&P                             Medical Decision Making Amount and/or Complexity of Data Reviewed Labs: ordered. Radiology: ordered.  Risk Prescription drug management.   This patient presents to the ED with concern for fatigue. This involves an extensive number of treatment options, and is a complaint that carries with it a high risk of complications and morbidity.  The differential diagnosis includes  anemia versus dehydration versus infection versus other   I ordered and personally interpreted labs.  The pertinent results include: Hypokalemia potassium 2.8.  Very minor nonspecific leukocytosis white blood cell count 11.8.  CK level within normal limits.  Magnesium within normal notes.  Glucose normal  I ordered imaging studies including x-ray of the chest I independently visualized and interpreted imaging which showed no emergent finding I agree with the radiologist interpretation  The patient was maintained on a cardiac monitor.  I personally viewed and interpreted the cardiac monitored which showed an underlying rhythm of: Sinus rhythm  Per my interpretation the patient's ECG shows no acute ischemic findings, normal QT, occasional PVC.  I ordered medication including IV and oral potassium for hypokalemia.  I  have reviewed the patients home medicines and have made adjustments as needed  Test Considered: Low suspicion for acute PE.  No tachycardia or hypoxia.  No chest discomfort.  The patient does not have any new or localizing stroke type symptoms.  I did not see any indication for emergent neuroimaging at this time   After the interventions noted above, I reevaluated the patient and found that they have: stayed the same  Based on this presentation I doubt CVA, sepsis, UTI, ACS, or other life-threatening medical emergency.  Patient is feeling well and wanting to go home.  She is easily tolerating p.o. fluids.  Her daughter is here to take her home at the bedside.  She has follow-up already arranged with her PCP   Dispostion:  After consideration of the diagnostic results and the patients response to treatment, I feel that the patent would benefit from discharge with close outpatient follow-up         Final Clinical Impression(s) / ED Diagnoses Final diagnoses:  Hypokalemia  Weakness    Rx / DC Orders ED Discharge Orders          Ordered    potassium chloride  (KLOR-CON) 10 MEQ tablet  Daily        10/01/22 1915              Terald Sleeper, MD 10/01/22 2217

## 2022-10-01 NOTE — ED Triage Notes (Signed)
Pt c/o generalized weakness since this am; recent  diagnoses DM; doubling cholesterol over past couple of days due to uncontrolled cholesterol levels; no focal deficits

## 2022-10-01 NOTE — ED Provider Triage Note (Signed)
Emergency Medicine Provider Triage Evaluation Note  Brandi Saunders , a 52 y.o. female  was evaluated in triage.  Patient complains of generalized weakness since this morning.  She reports that she saw her doctor 5 days ago and that her doctor called her and said she was prediabetic.  Her doctor then called her again and said that she was actually diabetic and was calling in medication for her.  She also said that she had a level that was high and something else that was low but she cannot remember any of these.  She reports that her doctor was calling 5 medications but she is unsure what they are.  Also was told that her cholesterol was high so she has been doubling her cholesterol medications over the past 4 days.  Review of Systems  Positive:  Negative:   Physical Exam  BP 110/74   Pulse 81   Temp 98.8 F (37.1 C)   Resp 17   SpO2 95%  Gen:   Awake, no distress   Resp:  Normal effort  MSK:   Moves extremities without difficulty  Other:  Mild scleral icterus  Medical Decision Making  Medically screening exam initiated at 2:08 PM.  Appropriate orders placed.  Brandi Saunders was informed that the remainder of the evaluation will be completed by another provider, this initial triage assessment does not replace that evaluation, and the importance of remaining in the ED until their evaluation is complete.    Unfortunately cannot see patient's PCP records.  She reports they are with [facility].  Will go ahead and get generalized lab work to include a CK due to doubling cholesterol medications and generalized weakness   Brandi Saunders A, PA-C 10/01/22 1409

## 2022-10-01 NOTE — Discharge Instructions (Addendum)
Please schedule follow-up appointment your doctors office.  You should have your blood test rechecked including your potassium levels.  If you have any new or worsening symptoms that would include strokelike symptoms, difficulty with speech, loss of vision, worsening balance difficulties, please return to the ER immediately.  Likewise if you develop chest pain or pressure, loss of consciousness, or lightheadedness, please call 911 or return to the ER.

## 2022-11-06 IMAGING — DX DG CHEST 1V PORT
1 series · 1 of 1 positions shown · non-contrast
Comparison: None.

CLINICAL DATA: Chest pain.

EXAM:
PORTABLE CHEST 1 VIEW

[chest]
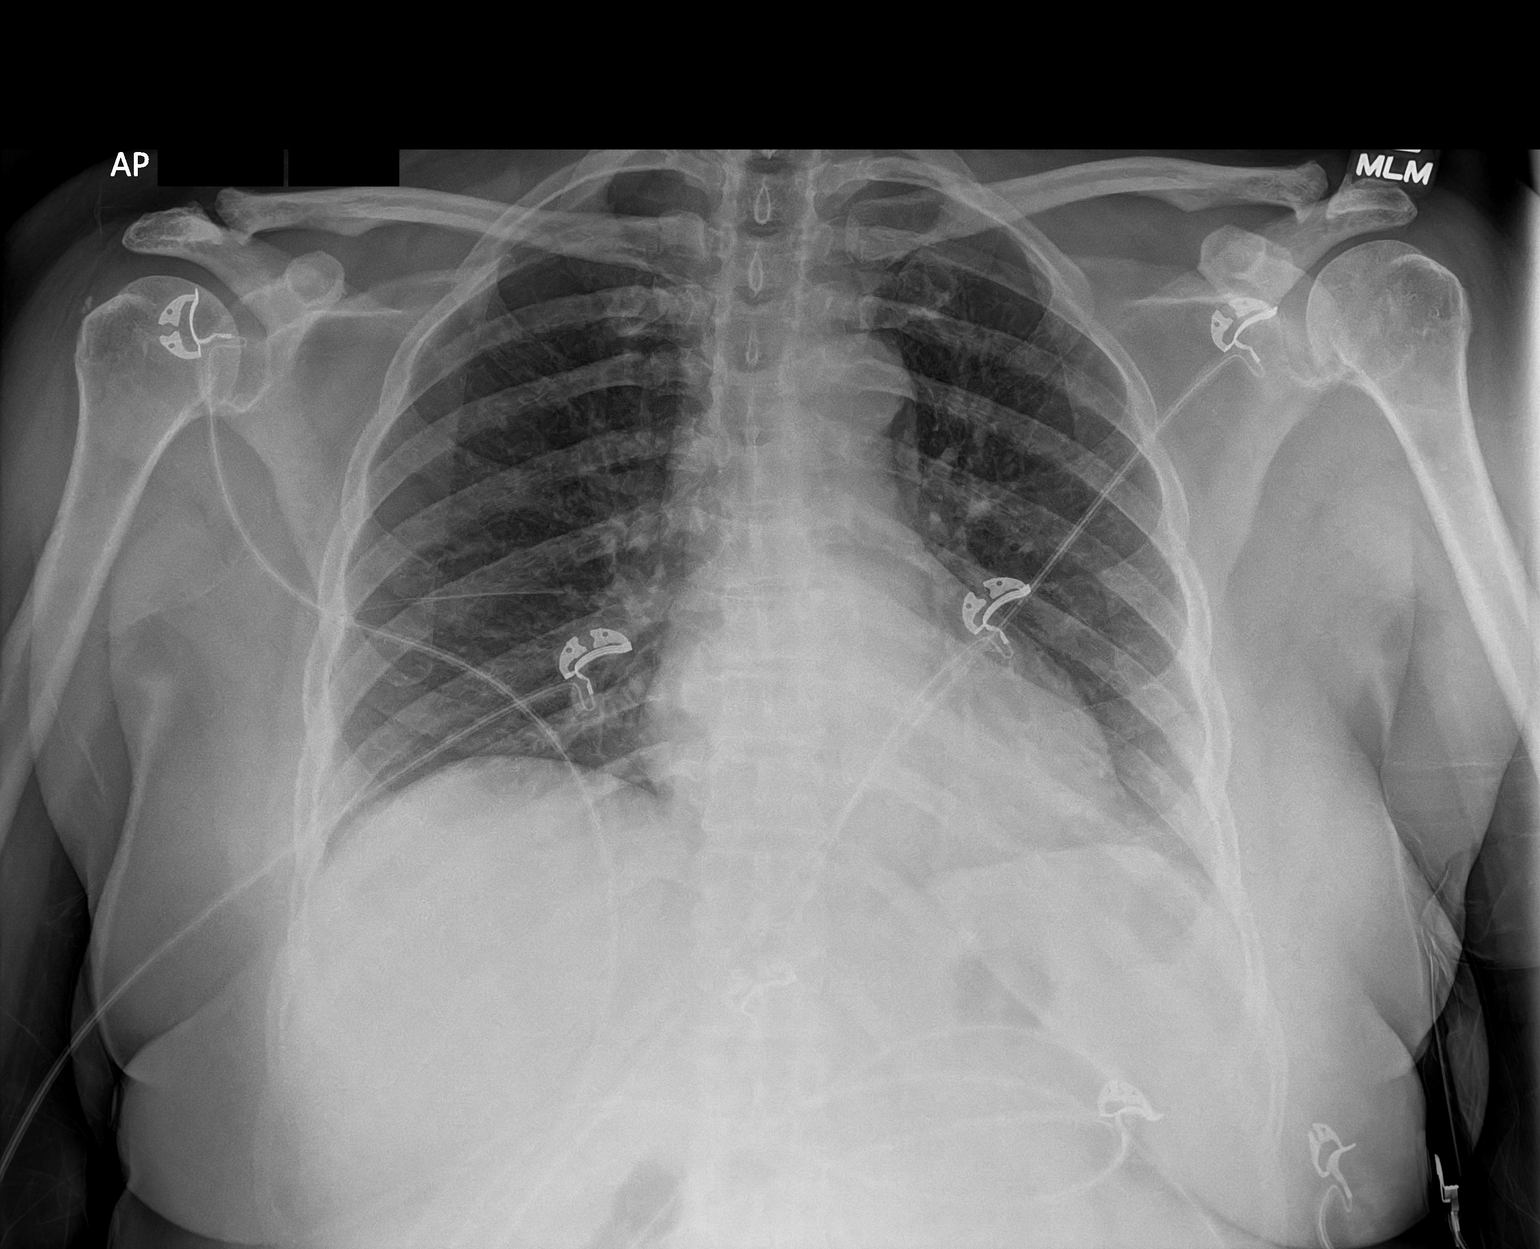

[1 of 1 positions shown; findings below may reference images not displayed]

FINDINGS: Mild left basilar opacities. No visible pneumothorax or pleural
effusion. Borderline enlargement of the cardiac silhouette. No acute
osseous abnormality. Slight S-shaped thoracic curvature.
IMPRESSION: 1. Mild left basilar opacities, which could represent atelectasis or
early pneumonia.
2. Borderline cardiomegaly.

## 2022-11-17 ENCOUNTER — Ambulatory Visit (HOSPITAL_COMMUNITY)
Admission: EM | Admit: 2022-11-17 | Discharge: 2022-11-17 | Disposition: A | Discharge status: Home / Self Care | Payer: 59 | Attending: Internal Medicine | Admitting: Internal Medicine

## 2022-11-17 ENCOUNTER — Encounter (HOSPITAL_COMMUNITY): Payer: Self-pay | Admitting: Emergency Medicine

## 2022-11-17 DIAGNOSIS — S39012A Strain of muscle, fascia and tendon of lower back, initial encounter: Secondary | ICD-10-CM

## 2022-11-17 MED ORDER — ACETAMINOPHEN 500 MG PO TABS: 1000.0000 mg | ORAL_TABLET | Freq: Four times a day (QID) | ORAL | 0 refills | Status: AC | PRN

## 2022-11-17 MED ORDER — DEXAMETHASONE SODIUM PHOSPHATE 10 MG/ML IJ SOLN
INTRAMUSCULAR | Status: AC
  Filled 2022-11-17: qty 1

## 2022-11-17 MED ORDER — DEXAMETHASONE SODIUM PHOSPHATE 10 MG/ML IJ SOLN
10.0000 mg | Freq: Once | INTRAMUSCULAR | Status: AC
  Administered 2022-11-17: 10 mg via INTRAMUSCULAR

## 2022-11-17 MED ORDER — METHOCARBAMOL 500 MG PO TABS: 500.0000 mg | ORAL_TABLET | Freq: Two times a day (BID) | ORAL | 0 refills | Status: DC

## 2022-11-17 NOTE — ED Triage Notes (Signed)
Pt reports was doing her daughter's hair when started having right lower back pains. Reports that sometimes pain will radiate to right leg.

## 2022-11-17 NOTE — Discharge Instructions (Signed)
Your pain is likely due to a muscle strain which will improve on its own with time.   - Take tylenol 1,000mg  every 6 hours as needed for pain. - I gave you a shot of steroid today to help with inflammation. This will work in your body over the next 2-3 days. - You may also take the prescribed muscle relaxer as directed as needed for muscle aches/spasm.  Do not take this medication and drive or drink alcohol as it can make you sleepy.  Mainly use this medicine at nighttime as needed. - Apply heat 20 minutes on then 20 minutes off and perform gentle range of motion exercises to the area of greatest pain to prevent muscle stiffness and provide further pain relief.   Red flag symptoms to watch out for are numbness/tingling to the legs, weakness, loss of bowel/bladder control, and/or worsening pain that does not respond well to medicines. Follow-up with your primary care provider or return to urgent care if your symptoms do not improve in the next 3 to 4 days with medications and interventions recommended today. If your symptoms are severe (red flag), please go to the emergency room.  I hope you feel better!

## 2022-11-17 NOTE — ED Provider Notes (Signed)
[facility]    CSN: [id number] Arrival date & time: 11/17/22  1509      History   Chief Complaint Chief Complaint  Patient presents with   Back Pain    HPI Brandi Saunders is a 52 y.o. female.   Patient presents urgent care for evaluation of right lower back pain that intermittently radiates to the right buttock and down the back of the right posterior thigh to the right knee starting today.  Patient was blow drying and brushing her daughter's hair with her arms in a position when the pain started.  States she had a stroke in 2015 with residual right-sided weakness and believes that she may have pulled a muscle/overused the right side of her body.  Denies urinary symptoms, urinary/stool incontinence, numbness/tingling to the bilateral lower extremities, saddle anesthesia symptoms, weakness, constipation, N/V/D, or fever/chills.  No recent falls, injuries, or trauma to the area of greatest tenderness.  Postmenopausal.  Sitting up and movement makes pain worse.  Nothing makes pain better.  She currently takes Plavix daily due to history of stroke and is not a candidate for NSAID use.  Has not attempted use of any over-the-counter medications before coming to urgent care.    Back Pain   Past Medical History:  Diagnosis Date   Diabetes mellitus without complication (HCC)    High cholesterol    Hypertension    Stroke 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 No pertinent surgical history.  OB History     Gravida  14   Para  7   Term      Preterm  1   AB  5   Living         SAB      IAB      Ectopic      Multiple      Live Births               Home Medications    Prior to Admission medications   Medication Sig Start Date End Date  Taking? Authorizing Provider  acetaminophen (TYLENOL) 500 MG tablet Take 2 tablets (1,000 mg total) by mouth every 6 (six) hours as needed. 11/17/22  Yes Carlisle Beers, FNP  methocarbamol (ROBAXIN) 500 MG tablet Take 1 tablet (500 mg total) by mouth 2 (two) times daily. 11/17/22  Yes Carlisle Beers, FNP  amLODipine (NORVASC) 5 MG tablet Take 1 tablet (5 mg total) by mouth daily. 05/19/21 05/19/22  Elson Areas, PA-C  clopidogrel (PLAVIX) 75 MG tablet Take 75 mg by mouth daily. 02/01/21   [provider]  hydrochlorothiazide (HYDRODIURIL) 25 MG tablet Take 25 mg by mouth daily. 02/01/21   [provider]  lisinopril (ZESTRIL) 40 MG tablet Take 40 mg by mouth daily. 02/01/21   [provider]  potassium chloride (KLOR-CON) 10 MEQ tablet Take 2 tablets (20 mEq total) by mouth daily for 10 days. 10/01/22 10/11/22  Terald Sleeper, MD  simvastatin (ZOCOR) 20 MG tablet simvastatin 20 mg tablet    [provider]    Family History No family history on file.  Social History Social History   Tobacco Use   Smoking status: Every Day    Packs/day: .5    Types: Cigarettes  Smokeless tobacco: Never  Vaping Use   Vaping Use: Never used  Substance Use Topics   Alcohol use: Yes    Comment: occasoinally   Drug use: Yes    Types: Marijuana    Comment: daily     Allergies   Patient has no known allergies.   Review of Systems Review of Systems  Musculoskeletal:  Positive for back pain.  Per HPI   Physical Exam Triage Vital Signs ED Triage Vitals  Enc Vitals Group     BP 11/17/22 1628 96/68     Pulse Rate 11/17/22 1628 71     Resp 11/17/22 1628 18     Temp 11/17/22 1628 97.8 F (36.6 C)     Temp Source 11/17/22 1628 Oral     SpO2 11/17/22 1628 96 %     Weight --      Height --      Head Circumference --      Peak Flow --      Pain Score 11/17/22 1627 10     Pain Loc --      Pain Edu? --      Excl. in GC? --    No data  found.  Updated Vital Signs BP 96/68 (BP Location: Left Arm)   Pulse 71   Temp 97.8 F (36.6 C) (Oral)   Resp 18   SpO2 96%   Visual Acuity Right Eye Distance:   Left Eye Distance:   Bilateral Distance:    Right Eye Near:   Left Eye Near:    Bilateral Near:     Physical Exam Vitals and nursing note reviewed.  Constitutional:      Appearance: She is not ill-appearing or toxic-appearing.  HENT:     Head: Normocephalic and atraumatic.     Right Ear: Hearing and external ear normal.     Left Ear: Hearing and external ear normal.     Nose: Nose normal.     Mouth/Throat:     Lips: Pink.  Eyes:     General: Lids are normal. Vision grossly intact. Gaze aligned appropriately.     Extraocular Movements: Extraocular movements intact.     Conjunctiva/sclera: Conjunctivae normal.  Cardiovascular:     Rate and Rhythm: Normal rate and regular rhythm.     Heart sounds: Normal heart sounds, S1 normal and S2 normal.  Pulmonary:     Effort: Pulmonary effort is normal. No respiratory distress.     Breath sounds: Normal breath sounds and air entry.  Musculoskeletal:     Cervical back: Normal and neck supple.     Thoracic back: Normal.     Lumbar back: Tenderness present. No swelling, edema, deformity, signs of trauma, lacerations, spasms or bony tenderness. Normal range of motion. Negative right straight leg raise test and negative left straight leg raise test. No scoliosis.       Back:     Comments: TTP to the right sided lower paraspinal muscles without tenderness to palpation of the midline spine.  Strength and sensation intact of bilateral lower extremities.   Skin:    General: Skin is warm and dry.     Capillary Refill: Capillary refill takes less than 2 seconds.     Findings: No rash.  Neurological:     General: No focal deficit present.     Mental Status: She is alert and oriented to person, place, and time. Mental status is at baseline.     Cranial Nerves: Cranial nerves 2-12  are intact. No dysarthria or facial asymmetry.     Sensory: Sensation is intact.     Motor: Motor function is intact.     Coordination: Coordination is intact.     Gait: Gait is intact.     Comments: Strength and sensation intact to bilateral upper and lower extremities (5/5). Moves all 4 extremities with normal coordination voluntarily. Non-focal neuro exam.   Psychiatric:        Mood and Affect: Mood normal.        Speech: Speech normal.        Behavior: Behavior normal.        Thought Content: Thought content normal.        Judgment: Judgment normal.      UC Treatments / Results  Labs (all labs ordered are listed, but only abnormal results are displayed) Labs Reviewed - No data to display  EKG   Radiology No results found.  Procedures Procedures (including critical care time)  Medications Ordered in UC Medications  dexamethasone (DECADRON) injection 10 mg (10 mg Intramuscular Given 11/17/22 1707)    Initial Impression / Assessment and Plan / UC Course  I have reviewed the triage vital signs and the nursing notes.  Pertinent labs & imaging results that were available during my care of the patient were reviewed by me and considered in my medical decision making (see chart for details).   1. Strain of lumbar region, initial encounter Evaluation suggests pain is muscular in nature. Will manage this with rest, gentle ROM exercises, heat therapy, tylenol as needed for pain, and as needed use of muscle relaxer. Drowsiness precautions discussed regarding muscle relaxer use.  She is not a candidate for NSAID use due to use of Plavix blood thinner. Dexamethasone 10 mg IM given in clinic.  Discussed that this may raise blood sugars temporarily, advised to watch diet over the next 2 to 3 days. Imaging: no indication for imaging based on stable musculoskeletal exam findings May follow-up with orthopedics as needed.  Discussed red flag signs and symptoms of worsening condition,when  to call the PCP office, return to urgent care, and when to seek higher level of care in the emergency department. Counseled patient regarding appropriate use of medications and potential side effects for all medications recommended or prescribed today. Patient verbalizes understanding and agreement with plan. Discharged in stable condition.     Final Clinical Impressions(s) / UC Diagnoses   Final diagnoses:  Strain of lumbar region, initial encounter     Discharge Instructions      Your pain is likely due to a muscle strain which will improve on its own with time.   - Take tylenol 1,000mg  every 6 hours as needed for pain. - I gave you a shot of steroid today to help with inflammation. This will work in your body over the next 2-3 days. - You may also take the prescribed muscle relaxer as directed as needed for muscle aches/spasm.  Do not take this medication and drive or drink alcohol as it can make you sleepy.  Mainly use this medicine at nighttime as needed. - Apply heat 20 minutes on then 20 minutes off and perform gentle range of motion exercises to the area of greatest pain to prevent muscle stiffness and provide further pain relief.   Red flag symptoms to watch out for are numbness/tingling to the legs, weakness, loss of bowel/bladder control, and/or worsening pain that does not respond well to medicines. Follow-up with your primary care  provider or return to urgent care if your symptoms do not improve in the next 3 to 4 days with medications and interventions recommended today. If your symptoms are severe (red flag), please go to the emergency room.  I hope you feel better!      ED Prescriptions     Medication Sig Dispense Auth. Provider   acetaminophen (TYLENOL) 500 MG tablet Take 2 tablets (1,000 mg total) by mouth every 6 (six) hours as needed. 30 tablet Carlisle Beers, FNP   methocarbamol (ROBAXIN) 500 MG tablet Take 1 tablet (500 mg total) by mouth 2 (two) times  daily. 20 tablet Carlisle Beers, FNP      PDMP not reviewed this encounter.   Carlisle Beers, [place] 11/17/22 [phone]

## 2022-12-10 ENCOUNTER — Other Ambulatory Visit: Payer: Self-pay | Admitting: Nurse Practitioner

## 2022-12-10 DIAGNOSIS — Z1211 Encounter for screening for malignant neoplasm of colon: Secondary | ICD-10-CM

## 2023-01-04 ENCOUNTER — Ambulatory Visit: Payer: 59

## 2023-01-31 ENCOUNTER — Ambulatory Visit
Admission: RE | Admit: 2023-01-31 | Discharge: 2023-01-31 | Disposition: A | Discharge status: Home / Self Care | Payer: 59 | Source: Ambulatory Visit | Attending: Nurse Practitioner | Admitting: Nurse Practitioner

## 2023-01-31 DIAGNOSIS — Z1211 Encounter for screening for malignant neoplasm of colon: Secondary | ICD-10-CM

## 2023-06-10 ENCOUNTER — Other Ambulatory Visit: Payer: Self-pay | Admitting: Registered Nurse

## 2023-06-10 DIAGNOSIS — F17210 Nicotine dependence, cigarettes, uncomplicated: Secondary | ICD-10-CM

## 2023-07-16 ENCOUNTER — Ambulatory Visit (HOSPITAL_COMMUNITY)
Admission: EM | Admit: 2023-07-16 | Discharge: 2023-07-16 | Disposition: A | Discharge status: Home / Self Care | Attending: Family Medicine | Admitting: Family Medicine

## 2023-07-16 ENCOUNTER — Encounter (HOSPITAL_COMMUNITY): Payer: Self-pay

## 2023-07-16 DIAGNOSIS — S46911A Strain of unspecified muscle, fascia and tendon at shoulder and upper arm level, right arm, initial encounter: Secondary | ICD-10-CM

## 2023-07-16 DIAGNOSIS — S161XXA Strain of muscle, fascia and tendon at neck level, initial encounter: Secondary | ICD-10-CM

## 2023-07-16 MED ORDER — METHOCARBAMOL 500 MG PO TABS: 500.0000 mg | ORAL_TABLET | Freq: Two times a day (BID) | ORAL | 0 refills | Status: AC

## 2023-07-16 MED ORDER — DEXAMETHASONE SODIUM PHOSPHATE 10 MG/ML IJ SOLN
INTRAMUSCULAR | Status: AC
  Filled 2023-07-16: qty 1

## 2023-07-16 MED ORDER — DEXAMETHASONE SODIUM PHOSPHATE 10 MG/ML IJ SOLN
10.0000 mg | Freq: Once | INTRAMUSCULAR | Status: AC
  Administered 2023-07-16: 10 mg via INTRAMUSCULAR

## 2023-07-16 NOTE — ED Triage Notes (Signed)
 Patient states she woke up a week ago and had pain on the right lateral neck and right shoulder. Patient states pain increases when she tries to raise her right arm.  Patient states she has been soaking with Epsom Salt, taking Tylenol ES, Advil PM, a prescribed muscle relaxer from a previous visit, and using [facility] with no relief.

## 2023-07-16 NOTE — Discharge Instructions (Addendum)
 Apply heat to shoulder and neck. Purchase OTC Lidocaine patches these are helpful with  managing pain of the shoulder and neck.

## 2023-07-16 NOTE — ED Notes (Signed)
 Patient called x1 for triage. No answer

## 2023-07-16 NOTE — ED Provider Notes (Signed)
 [facility]    CSN: [id number] Arrival date & time: 07/16/23  1143      History   Chief Complaint Chief Complaint  Patient presents with   Shoulder Pain   Neck Pain    HPI Brandi Saunders is a 53 y.o. female, CVA, DM type II, and hypertension. 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 Neck pain has been present for one week.  She had some relief overnight and when she applied some IcyHot and soaked in Epsom salt.  Upon awakening today pain has returned.  Is also attempted relief with methocarbamol with temporal relief.  She denies any injury. Past Medical History:  Diagnosis Date   Diabetes mellitus without complication (HCC)    High cholesterol    Hypertension    Stroke [facility])     Patient Active Problem List   Diagnosis Date Noted   Osteoarthritis of left acromioclavicular joint 11/04/2020   Impingement syndrome of left shoulder region 11/04/2020   Pain in joint of left shoulder 10/14/2020    History reviewed. No pertinent surgical history.  OB History     Gravida  14   Para  7   Term      Preterm  1   AB  5   Living         SAB      IAB      Ectopic      Multiple      Live Births               Home Medications    Prior to Admission medications   Medication Sig Start Date End Date Taking? Authorizing Provider  acetaminophen (TYLENOL) 500 MG tablet Take 2 tablets (1,000 mg total) by mouth every 6 (six) hours as needed. 11/17/22   Carlisle Beers, FNP  amLODipine (NORVASC) 5 MG tablet Take 1 tablet (5 mg total) by mouth daily. 05/19/21 05/19/22  Elson Areas, PA-C  clopidogrel (PLAVIX) 75 MG tablet Take 75 mg by mouth daily. 02/01/21   [provider]  hydrochlorothiazide (HYDRODIURIL) 25 MG tablet Take 25 mg by mouth daily. 02/01/21   [provider]  lisinopril (ZESTRIL) 40 MG tablet Take 40 mg by mouth daily. 02/01/21   [provider]  methocarbamol (ROBAXIN) 500 MG tablet Take 1 tablet (500 mg total) by mouth 2 (two) times daily. 07/16/23   Bing Neighbors, NP  potassium chloride (KLOR-CON) 10 MEQ tablet Take 2 tablets (20 mEq total) by mouth daily for 10 days. 10/01/22 10/11/22  Terald Sleeper, MD  simvastatin (ZOCOR) 20 MG tablet simvastatin 20 mg tablet    [provider]    Family History History reviewed. No pertinent family history.  Social History Social History   Tobacco Use   Smoking status: Every Day    Current packs/day: 0.50    Types: Cigarettes   Smokeless tobacco: Never  Vaping Use   Vaping status: Never Used  Substance Use Topics   Alcohol use: Yes    Comment: occasoinally   Drug use: Yes    Types: Marijuana    Comment: daily     Allergies   Patient has no known allergies.   Review of Systems Review of Systems  Musculoskeletal:  Positive for neck pain.     Physical Exam Triage Vital  Signs ED Triage Vitals [07/16/23 1322]  Encounter Vitals Group     BP (!) 132/92     Systolic BP Percentile      Diastolic BP Percentile      Pulse Rate 62     Resp 16     Temp 98.2 F (36.8 C)     Temp Source Oral     SpO2 99 %     Weight      Height      Head Circumference      Peak Flow      Pain Score 9     Pain Loc      Pain Education      Exclude from Growth Chart    No data found.  Updated Vital Signs BP (!) 132/92 (BP Location: Left Arm)   Pulse 62   Temp 98.2 F (36.8 C) (Oral)   Resp 16   SpO2 99%   Visual Acuity Right Eye Distance:   Left Eye Distance:   Bilateral Distance:    Right Eye Near:   Left Eye Near:    Bilateral Near:     Physical Exam Vitals reviewed.  Constitutional:      Appearance: Normal appearance.  HENT:     Head: Normocephalic and atraumatic.     Nose: Nose normal.  Eyes:     Extraocular Movements: Extraocular  movements intact.     Pupils: Pupils are equal, round, and reactive to light.  Cardiovascular:     Rate and Rhythm: Normal rate and regular rhythm.  Pulmonary:     Effort: Pulmonary effort is normal.     Breath sounds: Normal breath sounds.  Musculoskeletal:     Right shoulder: Tenderness present. Decreased range of motion.     Left shoulder: Normal.     Cervical back: Neck supple. Pain with movement, spinous process tenderness and muscular tenderness present. Decreased range of motion.     Lumbar back: Negative right straight leg raise test and negative left straight leg raise test.  Skin:    General: Skin is warm and dry.  Neurological:     General: No focal deficit present.     Mental Status: She is alert and oriented to person, place, and time.      UC Treatments / Results  Labs (all labs ordered are listed, but only abnormal results are displayed) Labs Reviewed - No data to display  EKG   Radiology No results found.  Procedures Procedures (including critical care time)  Medications Ordered in UC Medications  dexamethasone (DECADRON) injection 10 mg (10 mg Intramuscular Given 07/16/23 1409)    Initial Impression / Assessment and Plan / UC Course  I have reviewed the triage vital signs and the nursing notes.  Pertinent labs & imaging results that were available during my care of the patient were reviewed by me and considered in my medical decision making (see chart for details).    Acute strain of the neck and shoulder pain treating with Decadron 10 mg IM injection given here in clinic.  Will avoid prescribing taper of prednisone as patient is a controlled diabetic.  Refilled methocarbamol.  Encouraged patient to get over-the-counter lidocaine patches and use heating pads as needed for management of pain.  If symptoms worsen or do not improve to follow-up with orthopedic at [facility].  Suspect patient likely has arthritis involving the neck and right shoulder as seen  on previous plain films she has osteoarthritis involving the left shoulder rotator  cuff.  Verbalized understanding and agreement with plan. Final Clinical Impressions(s) / UC Diagnoses   Final diagnoses:  Strain of neck muscle, initial encounter  Strain of right shoulder, initial encounter     Discharge Instructions      Apply heat to shoulder and neck. Purchase OTC Lidocaine patches these are helpful with  managing pain of the shoulder and neck.     ED Prescriptions     Medication Sig Dispense Auth. Provider   methocarbamol (ROBAXIN) 500 MG tablet Take 1 tablet (500 mg total) by mouth 2 (two) times daily. 30 tablet Bing Neighbors, NP      PDMP not reviewed this encounter.   Bing Neighbors, NP 07/16/23 2205

## 2023-10-07 ENCOUNTER — Encounter (HOSPITAL_COMMUNITY): Payer: Self-pay | Admitting: *Deleted

## 2023-10-07 ENCOUNTER — Other Ambulatory Visit: Payer: Self-pay

## 2023-10-07 ENCOUNTER — Ambulatory Visit (INDEPENDENT_AMBULATORY_CARE_PROVIDER_SITE_OTHER)

## 2023-10-07 ENCOUNTER — Ambulatory Visit (HOSPITAL_COMMUNITY)
Admission: EM | Admit: 2023-10-07 | Discharge: 2023-10-07 | Disposition: A | Discharge status: Home / Self Care | Attending: Family Medicine | Admitting: Family Medicine

## 2023-10-07 DIAGNOSIS — M79644 Pain in right finger(s): Secondary | ICD-10-CM

## 2023-10-07 DIAGNOSIS — M79642 Pain in left hand: Secondary | ICD-10-CM

## 2023-10-07 DIAGNOSIS — M79641 Pain in right hand: Secondary | ICD-10-CM

## 2023-10-07 DIAGNOSIS — M79645 Pain in left finger(s): Secondary | ICD-10-CM

## 2023-10-07 MED ORDER — METHYLPREDNISOLONE 4 MG PO TBPK: ORAL_TABLET | ORAL | 0 refills | Status: AC

## 2023-10-07 NOTE — ED Provider Notes (Addendum)
 [facility]    CSN: [id number] Arrival date & time: 10/07/23  1035      History   Chief Complaint No chief complaint on file.   HPI Brandi Saunders is a 53 y.o. female.   Patient is presenting with left thumb pain.  Patient notes no injury of the left thumb but states that she has been having some pain over the past 1 month.  Patient notes that she has significant bony abnormalities on both her left and right thumb.  No known history of autoimmune disease per patient.  Patient does note that she has history of stroke in 2015 and has been on Plavix since 2015.  Patient otherwise has no other concerns at this time.     Past Medical History:  Diagnosis Date   Diabetes mellitus without complication (HCC)    High cholesterol    Hypertension    Stroke [facility])     Patient Active Problem List   Diagnosis Date Noted   Osteoarthritis of left acromioclavicular joint 11/04/2020   Impingement syndrome of left shoulder region 11/04/2020   Pain in joint of left shoulder 10/14/2020    History reviewed. No pertinent surgical history.  OB History     Gravida  14   Para  7   Term      Preterm  1   AB  5   Living         SAB      IAB      Ectopic      Multiple      Live Births               Home Medications    Prior to Admission medications   Medication Sig Start Date End Date Taking? Authorizing Provider  acetaminophen  (TYLENOL ) 500 MG tablet Take 2 tablets (1,000 mg total) by mouth every 6 (six) hours as needed. 11/17/22  Yes Starlene Eaton, FNP  amLODipine  (NORVASC ) 5 MG tablet Take 1 tablet (5 mg total) by mouth daily. 05/19/21 10/07/23 Yes Sofia, Leslie K, PA-C  clopidogrel (PLAVIX) 75 MG tablet Take 75 mg by mouth daily. 02/01/21  Yes [provider]  hydrochlorothiazide (HYDRODIURIL) 25 MG tablet Take 25 mg by mouth daily. 02/01/21  Yes [provider]  lisinopril (ZESTRIL) 40 MG tablet Take 40 mg by mouth daily. 02/01/21   Yes [provider]  methylPREDNISolone (MEDROL DOSEPAK) 4 MG TBPK tablet Follow instructions on package 10/07/23  Yes Veretta Sabourin, MD  potassium chloride  (KLOR-CON ) 10 MEQ tablet Take 2 tablets (20 mEq total) by mouth daily for 10 days. 10/01/22 10/07/23 Yes Trifan, Janalyn Me, MD  simvastatin (ZOCOR) 20 MG tablet simvastatin 20 mg tablet   Yes [provider]  methocarbamol  (ROBAXIN ) 500 MG tablet Take 1 tablet (500 mg total) by mouth 2 (two) times daily. 07/16/23   Buena Carmine, NP    Family History History reviewed. No pertinent family history.  Social History Social History   Tobacco Use   Smoking status: Every Day    Current packs/day: 0.50    Types: Cigarettes   Smokeless tobacco: Never  Vaping Use   Vaping status: Never Used  Substance Use Topics   Alcohol use: Yes    Comment: occasoinally   Drug use: Yes    Types: Marijuana    Comment: daily     Allergies   Patient has no known allergies.   Review of Systems Review of Systems   Physical Exam  Triage Vital Signs ED Triage Vitals  Encounter Vitals Group     BP 10/07/23 1128 123/86     Systolic BP Percentile --      Diastolic BP Percentile --      Pulse Rate 10/07/23 1128 70     Resp 10/07/23 1128 20     Temp 10/07/23 1128 (!) 97.3 F (36.3 C)     Temp src --      SpO2 10/07/23 1128 99 %     Weight --      Height --      Head Circumference --      Peak Flow --      Pain Score 10/07/23 1125 10     Pain Loc --      Pain Education --      Exclude from Growth Chart --    No data found.  Updated Vital Signs BP 123/86   Pulse 70   Temp (!) 97.3 F (36.3 C)   Resp 20   SpO2 99%   Visual Acuity Right Eye Distance:   Left Eye Distance:   Bilateral Distance:    Right Eye Near:   Left Eye Near:    Bilateral Near:     Physical Exam Inspection reveals bilateral thumb nodules at the MCP joints.  Left side is mildly tender to palpation.  Noted subluxation of the bilateral thumbs,  ulnarly.  No warmth or redness noted over the area.  Bony growth noted on the MCP bilaterally.  UC Treatments / Results  Labs (all labs ordered are listed, but only abnormal results are displayed) Labs Reviewed - No data to display  EKG   Radiology No results found.  Procedures Procedures (including critical care time)  Medications Ordered in UC Medications - No data to display  Initial Impression / Assessment and Plan / UC Course  I have reviewed the triage vital signs and the nursing notes.  Pertinent labs & imaging results that were available during my care of the patient were reviewed by me and considered in my medical decision making (see chart for details).     Suspect patient is likely dealing with some degenerative changes of the left thumb, there is noted subluxation of the bilateral MCPs on the thumb.  There is also some noted periarticular bony changes.  Did discuss with patient that outside of trauma it is difficult to understand how she may have caused this, possible autoimmune condition could also be in the differential. Did advise patient to follow-up with primary care doctor to see if there may be some blood work that can help identify if needed autoimmune conditions given her physical exam findings.  In the meantime, we will go ahead and do a Medrol Dosepak for some temporary relief.  Patient understanding and agreeable with plan. Final Clinical Impressions(s) / UC Diagnoses   Final diagnoses:  Bilateral hand pain     Discharge Instructions      Please follow instructions on your Medrol Dosepak.  Please follow-up with your primary care provider   ED Prescriptions     Medication Sig Dispense Auth. Provider   methylPREDNISolone (MEDROL DOSEPAK) 4 MG TBPK tablet Follow instructions on package 1 each Brihana Quickel, MD      PDMP not reviewed this encounter.   Jude Norton, MD 10/07/23 1229    Jude Norton, MD 10/07/23 [phone]

## 2023-10-07 NOTE — Discharge Instructions (Signed)
 Please follow instructions on your Medrol  Dosepak.  Please follow-up with your primary care provider

## 2023-10-07 NOTE — ED Triage Notes (Signed)
 PT takes metformin does unknown

## 2023-10-07 NOTE — ED Triage Notes (Signed)
 PT reports her Lt thumb is out of socket and wants it snapped back in. PT reports ongoing for one month.

## 2023-10-08 ENCOUNTER — Ambulatory Visit (HOSPITAL_COMMUNITY): Payer: Self-pay
